# Patient Record
Sex: Male | Born: 1990 | Race: White | Hispanic: No | Marital: Married | Smoking: Current every day smoker
Health system: Southern US, Community
[De-identification: ages and names within clinical notes are randomized; demographics above are authoritative.]

## PROBLEM LIST (undated history)

## (undated) DIAGNOSIS — I300 Acute nonspecific idiopathic pericarditis: Secondary | ICD-10-CM

## (undated) DIAGNOSIS — K219 Gastro-esophageal reflux disease without esophagitis: Secondary | ICD-10-CM

## (undated) HISTORY — PX: APPENDECTOMY: SHX54

## (undated) HISTORY — PX: OTHER SURGICAL HISTORY: SHX169

---

## 2017-05-08 ENCOUNTER — Other Ambulatory Visit: Payer: Self-pay

## 2017-05-08 ENCOUNTER — Encounter (HOSPITAL_COMMUNITY): Payer: Self-pay

## 2017-05-08 ENCOUNTER — Emergency Department (HOSPITAL_COMMUNITY)
Admission: EM | Admit: 2017-05-08 | Discharge: 2017-05-08 | Disposition: A | Discharge status: Home / Self Care | Payer: 59 | Attending: Emergency Medicine | Admitting: Emergency Medicine

## 2017-05-08 ENCOUNTER — Emergency Department (HOSPITAL_COMMUNITY): Payer: 59

## 2017-05-08 DIAGNOSIS — I300 Acute nonspecific idiopathic pericarditis: Secondary | ICD-10-CM | POA: Insufficient documentation

## 2017-05-08 DIAGNOSIS — F17210 Nicotine dependence, cigarettes, uncomplicated: Secondary | ICD-10-CM | POA: Diagnosis not present

## 2017-05-08 DIAGNOSIS — R112 Nausea with vomiting, unspecified: Secondary | ICD-10-CM | POA: Insufficient documentation

## 2017-05-08 DIAGNOSIS — R0789 Other chest pain: Secondary | ICD-10-CM | POA: Diagnosis present

## 2017-05-08 HISTORY — DX: Gastro-esophageal reflux disease without esophagitis: K21.9

## 2017-05-08 LAB — CBC WITH DIFFERENTIAL/PLATELET
BASOS PCT: 0 %
Basophils Absolute: 0 10*3/uL (ref 0.0–0.1)
EOS ABS: 0 10*3/uL (ref 0.0–0.7)
Eosinophils Relative: 0 %
HEMATOCRIT: 49.5 % (ref 39.0–52.0)
Hemoglobin: 16.9 g/dL (ref 13.0–17.0)
Lymphocytes Relative: 24 %
Lymphs Abs: 0.9 10*3/uL (ref 0.7–4.0)
MCH: 31 pg (ref 26.0–34.0)
MCHC: 34.1 g/dL (ref 30.0–36.0)
MCV: 90.8 fL (ref 78.0–100.0)
MONOS PCT: 10 %
Monocytes Absolute: 0.4 10*3/uL (ref 0.1–1.0)
Neutro Abs: 2.6 10*3/uL (ref 1.7–7.7)
Neutrophils Relative %: 66 %
Platelets: 139 10*3/uL — ABNORMAL LOW (ref 150–400)
RBC: 5.45 MIL/uL (ref 4.22–5.81)
RDW: 12 % (ref 11.5–15.5)
WBC: 4 10*3/uL (ref 4.0–10.5)

## 2017-05-08 LAB — COMPREHENSIVE METABOLIC PANEL
ALBUMIN: 3.8 g/dL (ref 3.5–5.0)
ALK PHOS: 57 U/L (ref 38–126)
ALT: 77 U/L — ABNORMAL HIGH (ref 17–63)
ANION GAP: 13 (ref 5–15)
AST: 46 U/L — ABNORMAL HIGH (ref 15–41)
BILIRUBIN TOTAL: 1.2 mg/dL (ref 0.3–1.2)
BUN: 14 mg/dL (ref 6–20)
CALCIUM: 8.7 mg/dL — AB (ref 8.9–10.3)
CO2: 22 mmol/L (ref 22–32)
Chloride: 98 mmol/L — ABNORMAL LOW (ref 101–111)
Creatinine, Ser: 1.13 mg/dL (ref 0.61–1.24)
GFR calc non Af Amer: >60 mL/min (ref >60)
GLUCOSE: 105 mg/dL — AB (ref 65–99)
POTASSIUM: 3.9 mmol/L (ref 3.5–5.1)
SODIUM: 133 mmol/L — AB (ref 135–145)
TOTAL PROTEIN: 7.7 g/dL (ref 6.5–8.1)

## 2017-05-08 LAB — URINALYSIS, ROUTINE W REFLEX MICROSCOPIC
BACTERIA UA: NONE SEEN
BILIRUBIN URINE: NEGATIVE
GLUCOSE, UA: NEGATIVE mg/dL
Ketones, ur: NEGATIVE mg/dL
LEUKOCYTES UA: NEGATIVE
NITRITE: NEGATIVE
PH: 5 (ref 5.0–8.0)
Protein, ur: 100 mg/dL — AB
SPECIFIC GRAVITY, URINE: 1.031 — AB (ref 1.005–1.030)

## 2017-05-08 LAB — TROPONIN I: Troponin I: <0.03 ng/mL (ref <0.03)

## 2017-05-08 LAB — SEDIMENTATION RATE: SED RATE: 7 mm/h (ref 0–16)

## 2017-05-08 LAB — C-REACTIVE PROTEIN: CRP: 3.2 mg/dL — ABNORMAL HIGH (ref <1.0)

## 2017-05-08 LAB — LIPASE, BLOOD: Lipase: 31 U/L (ref 11–51)

## 2017-05-08 MED ORDER — COLCHICINE 0.6 MG PO TABS: 0.6000 mg | ORAL_TABLET | Freq: Two times a day (BID) | ORAL | 0 refills | Status: DC

## 2017-05-08 MED ORDER — GI COCKTAIL ~~LOC~~
30.0000 mL | Freq: Once | ORAL | Status: AC
  Administered 2017-05-08: 30 mL via ORAL
  Filled 2017-05-08: qty 30

## 2017-05-08 MED ORDER — PANTOPRAZOLE SODIUM 40 MG PO TBEC
40.0000 mg | DELAYED_RELEASE_TABLET | Freq: Once | ORAL | Status: AC
  Administered 2017-05-08: 40 mg via ORAL
  Filled 2017-05-08: qty 1

## 2017-05-08 MED ORDER — KETOROLAC TROMETHAMINE 30 MG/ML IJ SOLN
60.0000 mg | Freq: Once | INTRAMUSCULAR | Status: AC
  Administered 2017-05-08: 60 mg via INTRAMUSCULAR
  Filled 2017-05-08: qty 2

## 2017-05-08 MED ORDER — KETOROLAC TROMETHAMINE 30 MG/ML IJ SOLN
30.0000 mg | Freq: Once | INTRAMUSCULAR | Status: DC
Start: 2017-05-08 — End: 2017-05-08

## 2017-05-08 MED ORDER — OMEPRAZOLE 20 MG PO CPDR: 20.0000 mg | DELAYED_RELEASE_CAPSULE | Freq: Every day | ORAL | 0 refills | Status: DC

## 2017-05-08 MED ORDER — PANTOPRAZOLE SODIUM 40 MG IV SOLR: 40.0000 mg | Freq: Once | INTRAVENOUS | Status: DC

## 2017-05-08 MED ORDER — IBUPROFEN 800 MG PO TABS: 800.0000 mg | ORAL_TABLET | Freq: Three times a day (TID) | ORAL | 0 refills | Status: DC

## 2017-05-08 NOTE — ED Notes (Signed)
Patient transported to X-ray 

## 2017-05-08 NOTE — ED Triage Notes (Signed)
Pt reports having the epigastric pain (substernal) start yesterday. Pt reports he has had episodes like this before and most of the time he can take a TUMS and it would resolve, but this time it has not resolved. Pt reports being "sick, maybe the flu" b/c he received flu shot last week. Has had nausea and vomiting with decreased appetite. Pt has consumed half bowl of mash potatoes today and the pain got worse. No vomiting today, last time was yesterday, pt able to keep down fluids today. Describes pain as dull aching. Denies SOB, sweating, or pain radiation.

## 2017-05-08 NOTE — Discharge Instructions (Signed)
Take medications as prescribed.  Establish care with a primary care physician.  Return to the ED if you develop new or worsening symptoms.

## 2017-05-08 NOTE — ED Provider Notes (Signed)
[facility] EMERGENCY DEPARTMENT Provider Note   CSN: [id number] Arrival date & time: 05/08/17  0434     History   Chief Complaint Chief Complaint  Patient presents with  . Abdominal Pain    pain located substernal    HPI Richard Sanchez is a 27 y.o. male.  Patient with history of self diagnosed acid reflux presenting with substernal central chest pain since 3 PM yesterday.  This started after he ate a bowl of potatoes.  He has had episodes like this in the past that usually improved with Tums but he took Tums 4 times in the past 12 hours with no resolution of symptoms.  Denies any nausea or vomiting.  States she was having several episodes of vomiting 2 or 3 days ago after eating.  He has not had any vomiting for the past 2 days.  No fever.  Pain is constant.  Nothing makes it better or worse.  It is somewhat better with bending forward.  It does not radiate to her arms, back or neck.  No shortness of breath, fever, chills, urinary symptoms.  Denies any excessive alcohol or NSAID use.  previous appendectomy.   The history is provided by the patient.  Abdominal Pain   Associated symptoms include nausea and vomiting. Pertinent negatives include fever, headaches and arthralgias.    Past Medical History:  Diagnosis Date  . Acid reflux     There are no active problems to display for this patient.   Past Surgical History:  Procedure Laterality Date  . APPENDECTOMY    . kidney surgery (38 months old)         Home Medications    Prior to Admission medications   Not on File    Family History No family history on file.  Social History Social History   Tobacco Use  . Smoking status: Current Some Day Smoker    Packs/day: 0.50    Types: Cigarettes  . Smokeless tobacco: Never Used  Substance Use Topics  . Alcohol use: Yes    Comment: occasionally  . Drug use: No     Allergies   Patient has no known allergies.   Review of Systems Review of Systems    Constitutional: Negative for activity change, appetite change and fever.  HENT: Negative for congestion and nosebleeds.   Eyes: Negative for visual disturbance.  Respiratory: Negative for chest tightness.   Gastrointestinal: Positive for abdominal pain, nausea and vomiting. Negative for blood in stool.  Genitourinary: Negative for testicular pain and urgency.  Musculoskeletal: Negative for arthralgias and back pain.  Skin: Negative for rash.  Neurological: Negative for dizziness, weakness and headaches.   all other systems are negative except as noted in the HPI and PMH.     Physical Exam Updated Vital Signs BP (!) 138/105 (BP Location: Right Arm)   Pulse (!) 112   Temp 99.3 F (37.4 C) (Oral)   Resp 13   Ht 5\' 10"  (1.778 m)   Wt 107 kg (236 lb)   SpO2 96%   BMI 33.86 kg/m   Physical Exam  Constitutional: He is oriented to person, place, and time. He appears well-developed and well-nourished. No distress.  HENT:  Head: Normocephalic and atraumatic.  Mouth/Throat: Oropharynx is clear and moist. No oropharyngeal exudate.  Eyes: Conjunctivae and EOM are normal. Pupils are equal, round, and reactive to light.  Neck: Normal range of motion. Neck supple.  No meningismus.  Cardiovascular: Normal rate, regular rhythm, normal heart sounds and  intact distal pulses.  No murmur heard. Pulmonary/Chest: Effort normal and breath sounds normal. No respiratory distress. He exhibits no tenderness.  Chest wall nontender  Abdominal: Soft. There is no tenderness. There is no rebound and no guarding.  Abdomen soft.  No right upper quadrant tenderness.  No epigastric tenderness  Musculoskeletal: Normal range of motion. He exhibits no edema or tenderness.  No CVA tenderness  Neurological: He is alert and oriented to person, place, and time. No cranial nerve deficit. He exhibits normal muscle tone. Coordination normal.  No ataxia on finger to nose bilaterally. No pronator drift. 5/5 strength  throughout. CN 2-12 intact.Equal grip strength. Sensation intact.   Skin: Skin is warm.  Psychiatric: He has a normal mood and affect. His behavior is normal.  Nursing note and vitals reviewed.    ED Treatments / Results  Labs (all labs ordered are listed, but only abnormal results are displayed) Labs Reviewed  CBC WITH DIFFERENTIAL/PLATELET - Abnormal; Notable for the following components:      Result Value   Platelets 139 (*)    All other components within normal limits  COMPREHENSIVE METABOLIC PANEL - Abnormal; Notable for the following components:   Sodium 133 (*)    Chloride 98 (*)    Glucose, Bld 105 (*)    Calcium 8.7 (*)    AST 46 (*)    ALT 77 (*)    All other components within normal limits  LIPASE, BLOOD  TROPONIN I  URINALYSIS, ROUTINE W REFLEX MICROSCOPIC  SEDIMENTATION RATE  C-REACTIVE PROTEIN    EKG  EKG Interpretation  Date/Time:  Tuesday May 08 2017 05:04:48 EST Ventricular Rate:  93 PR Interval:    QRS Duration: 89 QT Interval:  336 QTC Calculation: 418 R Axis:   -41 Text Interpretation:  Sinus rhythm Left axis deviation ST elevation suggests acute pericarditis No previous ECGs available Confirmed by Glynn Octaveancour, Buffy Ehler [phone]([id number]) on 05/08/2017 5:11:22 AM       Radiology Dg Abdomen Acute W/chest  Result Date: 05/08/2017 CLINICAL DATA:  Acute onset of epigastric abdominal pain and substernal chest pain. Nausea and vomiting. EXAM: DG ABDOMEN ACUTE W/ 1V CHEST COMPARISON:  None. FINDINGS: The lungs are well-aerated and clear. There is no evidence of focal opacification, pleural effusion or pneumothorax. The cardiomediastinal silhouette is within normal limits. The visualized bowel gas pattern is unremarkable. Scattered stool and air are seen within the colon; there is no evidence of small bowel dilatation to suggest obstruction. No free intra-abdominal air is identified on the provided upright view. No acute osseous abnormalities are seen; the sacroiliac  joints are unremarkable in appearance. IMPRESSION: 1. Unremarkable bowel gas pattern; no free intra-abdominal air seen. Small amount of stool noted in the colon. 2. No acute cardiopulmonary process seen. Electronically Signed   By: Roanna RaiderJeffery  Chang M.D.   On: 05/08/2017 05:46    Procedures Procedures (including critical care time)  Medications Ordered in ED Medications  gi cocktail (Maalox,Lidocaine,Donnatal) (not administered)     Initial Impression / Assessment and Plan / ED Course  I have reviewed the triage vital signs and the nursing notes.  Pertinent labs & imaging results that were available during my care of the patient were reviewed by me and considered in my medical decision making (see chart for details).    Patient with 12-hour history of constant substernal chest pain recent nausea and vomiting several days ago.  Chest pain is not reproducible.  There is no abdominal tenderness.  EKG with J  point elevation, consider pericarditis.   Labs reassuring.  Minimal elevation of transaminases.  Lipase normal.  Patient's pain is somewhat improved with bending forward which is consistent with pericarditis.  Troponin negative.  No pericardial effusion on bedside ultrasound. no obvious gallstones.  Will treat for possible pericarditis with NSAIDs and colchicine. Will also start PPI. Low suspicion for ACS or PE.  Needs to establish care with PCP. Return precautions discussed.    EMERGENCY DEPARTMENT [place] CARDIAC EXAM "Study: Limited Ultrasound of the Heart and Pericardium"  INDICATIONS:Chest pain Multiple views of the heart and pericardium were obtained in real-time with a multi-frequency probe.  PERFORMED ZO:XWRUEA IMAGES ARCHIVED?: Yes LIMITATIONS:  Body habitus VIEWS USED: Apical 4 chamber  INTERPRETATION: Cardiac activity present, Pericardial effusioin absent, Cardiac tamponade absent and Normal contractility No pericardial effusion. No obvious gallstones or gallbladder wall  thickening.   Final Clinical Impressions(s) / ED Diagnoses   Final diagnoses:  Acute idiopathic pericarditis  Atypical chest pain    ED Discharge Orders    None       Anneta Rounds, Jeannett Senior, MD 05/08/17 [phone]

## 2017-05-14 ENCOUNTER — Encounter: Payer: Self-pay | Admitting: Gastroenterology

## 2017-05-28 ENCOUNTER — Emergency Department: Payer: 59

## 2017-05-28 ENCOUNTER — Other Ambulatory Visit: Payer: Self-pay

## 2017-05-28 ENCOUNTER — Encounter: Payer: Self-pay | Admitting: Emergency Medicine

## 2017-05-28 DIAGNOSIS — Z79899 Other long term (current) drug therapy: Secondary | ICD-10-CM | POA: Insufficient documentation

## 2017-05-28 DIAGNOSIS — E869 Volume depletion, unspecified: Secondary | ICD-10-CM | POA: Diagnosis not present

## 2017-05-28 DIAGNOSIS — R55 Syncope and collapse: Secondary | ICD-10-CM | POA: Insufficient documentation

## 2017-05-28 DIAGNOSIS — R112 Nausea with vomiting, unspecified: Secondary | ICD-10-CM | POA: Diagnosis not present

## 2017-05-28 DIAGNOSIS — F17210 Nicotine dependence, cigarettes, uncomplicated: Secondary | ICD-10-CM | POA: Insufficient documentation

## 2017-05-28 DIAGNOSIS — R42 Dizziness and giddiness: Secondary | ICD-10-CM | POA: Insufficient documentation

## 2017-05-28 LAB — CBC
HCT: 45.1 % (ref 40.0–52.0)
HEMOGLOBIN: 15.5 g/dL (ref 13.0–18.0)
MCH: 31.1 pg (ref 26.0–34.0)
MCHC: 34.4 g/dL (ref 32.0–36.0)
MCV: 90.5 fL (ref 80.0–100.0)
PLATELETS: 235 10*3/uL (ref 150–440)
RBC: 4.98 MIL/uL (ref 4.40–5.90)
RDW: 13 % (ref 11.5–14.5)
WBC: 13 10*3/uL — ABNORMAL HIGH (ref 3.8–10.6)

## 2017-05-28 LAB — BASIC METABOLIC PANEL
ANION GAP: 11 (ref 5–15)
BUN: 17 mg/dL (ref 6–20)
CHLORIDE: 105 mmol/L (ref 101–111)
CO2: 24 mmol/L (ref 22–32)
Calcium: 9.7 mg/dL (ref 8.9–10.3)
Creatinine, Ser: 0.99 mg/dL (ref 0.61–1.24)
GFR calc Af Amer: >60 mL/min (ref >60)
GFR calc non Af Amer: >60 mL/min (ref >60)
GLUCOSE: 110 mg/dL — AB (ref 65–99)
POTASSIUM: 3.7 mmol/L (ref 3.5–5.1)
Sodium: 140 mmol/L (ref 135–145)

## 2017-05-28 LAB — TROPONIN I

## 2017-05-28 NOTE — ED Triage Notes (Signed)
Pt arrived to the ED for complaints of being "light headed." Pt reports that he was working doing extraneous work and felt like he was going to "pass out" while breathing fast. Pt reports that he felt better and continued to work. Later on the patient felt sick again and went to the bathrom and vomited.Pt is AOx4 in no apparent distress.

## 2017-05-29 ENCOUNTER — Encounter: Payer: Self-pay | Admitting: Emergency Medicine

## 2017-05-29 ENCOUNTER — Emergency Department
Admission: EM | Admit: 2017-05-29 | Discharge: 2017-05-29 | Disposition: A | Discharge status: Home / Self Care | Payer: 59 | Attending: Emergency Medicine | Admitting: Emergency Medicine

## 2017-05-29 DIAGNOSIS — E869 Volume depletion, unspecified: Secondary | ICD-10-CM

## 2017-05-29 DIAGNOSIS — R55 Syncope and collapse: Secondary | ICD-10-CM

## 2017-05-29 HISTORY — DX: Acute nonspecific idiopathic pericarditis: I30.0

## 2017-05-29 MED ORDER — SODIUM CHLORIDE 0.9 % IV BOLUS (SEPSIS)
1000.0000 mL | INTRAVENOUS | Status: AC
  Administered 2017-05-29: 1000 mL via INTRAVENOUS

## 2017-05-29 NOTE — ED Notes (Signed)
Pt reports that he was at work when he felt really light headed and he stepped outside and sat down to feel better because he felt like he was going to throw up and pass out. He said his face felt "tingly". He said he went back inside and tried to work again when the same thing happened.

## 2017-05-29 NOTE — Discharge Instructions (Signed)
You have been seen today in the Emergency Department (ED)  for near-syncope (almost passing out).  Your workup including labs and EKG show reassuring results.  Your symptoms may be due to dehydration, so it is important that you drink plenty of non-alcoholic fluids. ° °Please call your regular doctor as soon as possible to schedule the next available clinic appointment to follow up with him/her regarding your visit to the ED and your symptoms.  Return to the Emergency Department (ED)  if you have any further syncopal episodes (pass out again) or develop ANY chest pain, pressure, tightness, trouble breathing, sudden sweating, or other symptoms that concern you. ° °

## 2017-05-29 NOTE — ED Provider Notes (Signed)
[facility] Emergency Department Provider Note  ____________________________________________   First MD Initiated Contact with Patient 05/29/17 [phone]     (approximate)  I have reviewed the triage vital signs and the nursing notes.   HISTORY  Chief Complaint Near Syncope    HPI Richard Sanchez is a 27 y.o. male with medical history as listed below (the pericarditis was diagnosed not quite 3 weeks ago) who presents for evaluation of lightheadedness, feeling like he was going to pass out, and a couple of episodes of vomiting.  All this occurred while he was at work.  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  Today he was working at [employer] when he began to feel lightheaded.  He continue to work through it but then he got progressively worse and his symptoms became severe.  After resting for 10 or 15 minutes he felt better, but then he went back to work and started to feel like he was going to pass out.  He had a couple of episodes of vomiting, and was diaphoretic and persistently nauseated until he rested a bit more.  Currently he is asymptomatic.  He has not been ill recently except for the visit to [facility] not quite 3 weeks ago.  At that time he was having chest pain versus upper abdominal pain and was diagnosed with idiopathic pericarditis.   Starting on a PPI he completed a course of colchicine and he states that he has not had any additional symptoms since that time.  Denies recent fever/chills, chest pain today, shortness of breath, abdominal pain, and dysuria.  Resting made the symptoms better and exertion made the symptoms worse.  They were severe at their worst.   Past Medical History:  Diagnosis Date  . Acid reflux   . Idiopathic pericarditis     There are no active problems to display for this patient.   Past Surgical History:  Procedure Laterality Date  . APPENDECTOMY    . kidney surgery (30 months old)      Prior  to Admission medications   Medication Sig Start Date End Date Taking? Authorizing Provider  colchicine 0.6 MG tablet Take 1 tablet (0.6 mg total) by mouth 2 (two) times daily. 05/08/17   Rancour, Jeannett Senior, MD  ibuprofen (ADVIL,MOTRIN) 800 MG tablet Take 1 tablet (800 mg total) by mouth 3 (three) times daily. 05/08/17   Rancour, Jeannett Senior, MD  omeprazole (PRILOSEC) 20 MG capsule Take 1 capsule (20 mg total) by mouth daily. 05/08/17   Glynn Octave, MD    Allergies Patient has no known allergies.  History reviewed. No pertinent family history.  Social History Social History   Tobacco Use  . Smoking status: Current Some Day Smoker    Packs/day: 0.50    Types: Cigarettes  . Smokeless tobacco: Never Used  Substance Use Topics  . Alcohol use: Yes    Comment: occasionally  . Drug use: No    Review of Systems Constitutional: No fever/chills Eyes: No visual changes. ENT: No sore throat. Cardiovascular: Denies chest pain. Respiratory: Denies shortness of breath. Gastrointestinal: No abdominal pain.  Nausea with several episodes of emesis in a row.  No diarrhea.  No constipation. Genitourinary: Negative for dysuria. Musculoskeletal: Negative for neck pain.  Negative for back pain. Integumentary: Negative for rash. Neurological: Lightheaded and dizzy, nearly passed out.  No focal numbness nor weakness.  No headache.   ____________________________________________   PHYSICAL EXAM:  VITAL SIGNS: ED  Triage Vitals  Enc Vitals Group     BP 05/28/17 2317 (!) 160/105     Pulse Rate 05/28/17 2317 96     Resp 05/28/17 2317 16     Temp 05/28/17 2317 97.7 F (36.5 C)     Temp Source 05/28/17 2317 Oral     SpO2 05/28/17 2317 96 %     Weight 05/28/17 2318 107 kg (236 lb)     Height 05/28/17 2318 1.778 m (5\' 10" )     Head Circumference --      Peak Flow --      Pain Score 05/28/17 2317 0     Pain Loc --      Pain Edu? --      Excl. in GC? --     Constitutional: Alert and oriented. Well  appearing and in no acute distress. Eyes: Conjunctivae are normal.  Head: Atraumatic. Nose: No congestion/rhinnorhea. Mouth/Throat: Mucous membranes are moist. Neck: No stridor.  No meningeal signs.   Cardiovascular: Normal rate, regular rhythm. Good peripheral circulation. Grossly normal heart sounds. Respiratory: Normal respiratory effort.  No retractions. Lungs CTAB. Gastrointestinal: Soft and nontender. No distention.  Musculoskeletal: No lower extremity tenderness nor edema. No gross deformities of extremities. Neurologic:  Normal speech and language. No gross focal neurologic deficits are appreciated.  Skin:  Skin is warm, dry and intact. No rash noted. Psychiatric: Mood and affect are normal. Speech and behavior are normal.  ____________________________________________   LABS (all labs ordered are listed, but only abnormal results are displayed)  Labs Reviewed  BASIC METABOLIC PANEL - Abnormal; Notable for the following components:      Result Value   Glucose, Bld 110 (*)    All other components within normal limits  CBC - Abnormal; Notable for the following components:   WBC 13.0 (*)    All other components within normal limits  TROPONIN I   ____________________________________________  EKG  ED ECG REPORT I, Loleta Roseory Miray Mancino, the attending physician, personally viewed and interpreted this ECG.  Date: 05/28/2017 EKG Time: 23:19 Rate: 97 Rhythm: normal sinus rhythm QRS Axis: normal Intervals: normal ST/T Wave abnormalities: normal Narrative Interpretation: no evidence of acute ischemia  ____________________________________________  RADIOLOGY   Dg Chest 2 View  Result Date: 05/28/2017 CLINICAL DATA:  Dizziness and near syncope EXAM: CHEST  2 VIEW COMPARISON:  Chest radiograph 05/08/2017 FINDINGS: The heart size and mediastinal contours are within normal limits. Both lungs are clear. The visualized skeletal structures are unremarkable. IMPRESSION: No active  cardiopulmonary disease. Electronically Signed   By: Deatra RobinsonKevin  Herman M.D.   On: 05/28/2017 23:35    ____________________________________________   PROCEDURES  Critical Care performed: No   Procedure(s) performed:   Procedures   ____________________________________________   INITIAL IMPRESSION / ASSESSMENT AND PLAN / ED COURSE  As part of my medical decision making, I reviewed the following data within the electronic MEDICAL RECORD NUMBER Nursing notes reviewed and incorporated, Labs reviewed , EKG interpreted , Old chart reviewed and Notes from prior ED visits    Differential diagnosis includes, but is not limited to, vasovagal episode leading to near syncope in the setting of volume depletion, ACS or other cardiac cause of near syncope, CVA, tox/medication, infectious, etc. patient is well-appearing and asymptomatic at this time.  His vital signs are reassuring; even though he was slightly tachycardic and hypertensive upon arrival, his vital signs have stabilized.  He has gotten a liter of fluids and is asymptomatic.  I believe that his symptoms  were the result of volume depletion and working a lot recently.  He has no symptoms of pericarditis anymore and reassuring lab work; there is nothing to make me suspicious for myocarditis or other acute or emergent condition.  I discussed all this with the patient and he is comfortable with the plan for outpatient follow-up.  I gave my usual and customary return precautions.      ____________________________________________  FINAL CLINICAL IMPRESSION(S) / ED DIAGNOSES  Final diagnoses:  Near syncope  Volume depletion     MEDICATIONS GIVEN DURING THIS VISIT:  Medications  sodium chloride 0.9 % bolus 1,000 mL (1,000 mLs Intravenous New Bag/Given 05/29/17 0243)     ED Discharge Orders    None       Note:  This document was prepared using Dragon voice recognition software and may include unintentional dictation errors.    Loleta Rose, MD 05/29/17 [phone]

## 2017-06-07 ENCOUNTER — Encounter: Payer: Self-pay | Admitting: Gastroenterology

## 2017-06-07 ENCOUNTER — Ambulatory Visit: Payer: 59 | Admitting: Gastroenterology

## 2017-06-07 DIAGNOSIS — R079 Chest pain, unspecified: Secondary | ICD-10-CM | POA: Diagnosis not present

## 2017-06-07 MED ORDER — OMEPRAZOLE 20 MG PO CPDR: 20.0000 mg | DELAYED_RELEASE_CAPSULE | Freq: Every day | ORAL | 5 refills | Status: AC

## 2017-06-07 NOTE — Assessment & Plan Note (Signed)
MOST LIKELY DUE TO PERICARDITIS > GERD. SYMPTOMS CONTROLLED/RESOLVED.  DRINK WATER TO KEEP YOUR URINE LIGHT YELLOW.  Do not drink DIET SODA, AVOID HIGH FRUCTOSE CORN SYRUP, chew SUGAR FREE GUM, OR USE ARTIFICIAL SWEETENERS. USE STEVIA AS A SWEETENER, CANE SUGAR, RAW SUGAR, OR AGAVE NECTAR. CONTINUE YOUR WEIGHT LOSS EFFORTS. A WEIGHT OF 207 LBS OR LESS WILL KEEP YOUR BODY MASS INDEX(BMI) UNDER 30. AVOID REFLUX TRIGGERS.  HANDOUT GIVEN. FOLLOW A LOW FAT DIET. MEATS SHOULD BE BAKED, BROILED, OR BOILED. AVOID FRIED FOODS. CONTINUE OMEPRAZOLE.  TAKE 30 MINUTES PRIOR TO YOUR FIRST MEAL. PLEASE CALL WITH QUESTIONS OR CONCERNS. FOLLOW UP IN 6 MOS.

## 2017-06-07 NOTE — Progress Notes (Addendum)
   Subjective:    Patient ID: Richard Sanchez, male    DOB: 02-May-1991, 27 y.o.   MRN: [id number]  HPI SEEN ED JAN 1 FOR SEVERE CHEST PAIN. DIAGNOSED WITH PERICARDITIS. GIVENS MEDS A PRILOSEC. HAVEN'T HAD ANYMORE SYMPTOMS SINCE ADMISSION/DISCHARGE.  EPISODE PRECEDED BY NAUSEA/VOMITING(NO BLOOD). DIDN'T EAT NEXT DAY TRIED TO EAT. ATE @330 -4PM AND THEN CHEST STARTED HURTING AND GOT PROGRESSIVELY WORSE. COULDN'T SLEEP OR LAY DOWN. @4  AM CAME TO ED. SEVERE BURNING IN MIDDLE. NO RECREATIONAL DRUGS OR REGULAR ETOH. BMs: REGULAR.   PT DENIES FEVER, CHILLS, HEMATOCHEZIA, HEMATEMESIS, nausea, vomiting, melena, diarrhea, SHORTNESS OF BREATH, CHANGE IN BOWEL IN HABITS, constipation, abdominal pain, problems swallowing, problems with sedation, heartburn or indigestion.  Past Medical History:  Diagnosis Date  . Acid reflux   . Idiopathic pericarditis    Past Surgical History:  Procedure Laterality Date  . APPENDECTOMY    . kidney surgery 53(18 months old)     No Known Allergies  Current Outpatient Medications  Medication Sig Dispense Refill  . omeprazole (PRILOSEC) 20 MG capsule Take 1 capsule (20 mg total) by mouth daily. 30 capsule 0  .      .       Family History  Problem Relation Age of Onset  . Colon cancer Neg Hx   . Colon polyps Neg Hx    Social History   Socioeconomic History  . Marital status: Married    Spouse name: None  . Number of children: None  . Years of education: None  . Highest education level: None  Social Needs  . Financial resource strain: None  . Food insecurity - worry: None  . Food insecurity - inability: None  . Transportation needs - medical: None  . Transportation needs - non-medical: None  Occupational History  . None  Tobacco Use  . Smoking status: Former Smoker    Packs/day: 0.50    Types: Cigarettes  . Smokeless tobacco: Never Used  Substance and Sexual Activity  . Alcohol use: Yes    Comment: occasionally  . Drug use: No  . Sexual activity:  None  Other Topics Concern  . None  Social History Narrative   WORKS IN CONSTRUCTION: PIPE FITTER. MARRIED. WIFE IS PREGNANT. BORN IN [place] AND CAME TO [place] IN 1997.   Review of Systems PER HPI OTHERWISE ALL SYSTEMS ARE NEGATIVE.    Objective:   Physical Exam  Constitutional: He is oriented to person, place, and time. He appears well-developed and well-nourished. No distress.  HENT:  Head: Normocephalic and atraumatic.  Mouth/Throat: Oropharynx is clear and moist. No oropharyngeal exudate.  Eyes: Pupils are equal, round, and reactive to light. No scleral icterus.  Neck: Normal range of motion. Neck supple.  Cardiovascular: Normal rate, regular rhythm and normal heart sounds.  Pulmonary/Chest: Effort normal and breath sounds normal. No respiratory distress.  Abdominal: Soft. Bowel sounds are normal. He exhibits no distension. There is no tenderness.  Musculoskeletal: He exhibits no edema.  Lymphadenopathy:    He has no cervical adenopathy.  Neurological: He is alert and oriented to person, place, and time.  NO FOCAL DEFICITS  Skin:  MULTIPLE TATTOOS  Psychiatric: He has a normal mood and affect.  Vitals reviewed.     Assessment & Plan:

## 2017-06-07 NOTE — Progress Notes (Signed)
cc'ed to pcp °

## 2017-06-07 NOTE — Progress Notes (Signed)
ON RECALL  °

## 2017-06-07 NOTE — Patient Instructions (Addendum)
DRINK WATER TO KEEP YOUR URINE LIGHT YELLOW.  Do not drink DIET SODA, AVOID HIGH FRUCTOSE CORN SYRUP, chew SUGAR FREE GUM, OR USE ARTIFICIAL SWEETENERS. USE STEVIA AS A SWEETENER, CANE SUGAR, RAW SUGAR, OR AGAVE NECTAR.  CONTINUE YOUR WEIGHT LOSS EFFORTS. A WEIGHT OF 207 LBS OR LESS WILL KEEP YOUR BODY MASS INDEX(BMI) UNDER 30.  AVOID REFLUX TRIGGERS. SEE INFO BELOW.  FOLLOW A LOW FAT DIET. MEATS SHOULD BE BAKED, BROILED, OR BOILED. AVOID FRIED FOODS.  CONTINUE OMEPRAZOLE.  TAKE 30 MINUTES PRIOR TO YOUR FIRST MEAL.  PLEASE CALL WITH QUESTIONS OR CONCERNS.  FOLLOW UP IN 6 MOS.

## 2017-06-20 ENCOUNTER — Ambulatory Visit (INDEPENDENT_AMBULATORY_CARE_PROVIDER_SITE_OTHER): Payer: 59 | Admitting: Cardiovascular Disease

## 2017-06-20 ENCOUNTER — Encounter: Payer: Self-pay | Admitting: Cardiovascular Disease

## 2017-06-20 VITALS — BP 130/80 | HR 80 | Ht 70.0 in | Wt 224.6 lb

## 2017-06-20 DIAGNOSIS — K219 Gastro-esophageal reflux disease without esophagitis: Secondary | ICD-10-CM

## 2017-06-20 DIAGNOSIS — Z9289 Personal history of other medical treatment: Secondary | ICD-10-CM | POA: Diagnosis not present

## 2017-06-20 DIAGNOSIS — R55 Syncope and collapse: Secondary | ICD-10-CM | POA: Diagnosis not present

## 2017-06-20 DIAGNOSIS — I309 Acute pericarditis, unspecified: Secondary | ICD-10-CM

## 2017-06-20 NOTE — Progress Notes (Signed)
CARDIOLOGY CONSULT NOTE  Patient ID: Richard Sanchez MRN: [id number] DOB/AGE: [birth year] 27 y.o.  Admit date: (Not on file) Primary Physician: Zhou-Talbert, Elwyn Lade, MD Referring Physician: Alfonse Flavors, MD  Reason for Consultation: Pericarditis  HPI: Richard Sanchez is a 27 y.o. male who is being seen today for the evaluation of pericarditis at the request of Zhou-Talbert, Serena S,*.   He was evaluated in the ED on 05/08/17 for chest pain with nausea and vomiting.  I personally reviewed all relevant documentation, labs, and studies.  Symptoms improved with bending forward.  He was treated for pericarditis with NSAIDs and colchicine.  CBC was normal.  Renal function was normal.  ESR was normal.  C-reactive protein was elevated at 3.2.  Troponin was normal.  Radiography showed no evidence of an acute cardiopulmonary process.  I reviewed the ECG which demonstrated sinus tachycardia, 104 bpm, with subtle PR depression and J-point elevation.  He was then evaluated for near syncope on 05/29/17.  It occurred while he was at work.  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  He had associated nausea, vomiting, and diaphoresis.  Chest x-ray again showed no active cardiopulmonary disease.  He received IV fluids and symptoms resolved.  It was felt his symptoms were due to volume depletion.  Troponin was normal.  White blood cells were elevated at 13.  BUN 17, creatinine 0.99.  Sodium was 104.  I reviewed the ECG performed on 05/28/17 which demonstrated sinus rhythm with sinus arrhythmia and again subtle PR depressions and J-point elevation.  The patient denies any symptoms of chest pain, palpitations, shortness of breath, lightheadedness, dizziness, leg swelling, orthopnea, PND, and syncope.  He is back to working full-time and works roughly 10 hours daily.  He told me that during his most recent ED presentation for near syncope, he had been working in a very hot  environment and worked 12 hours without having eaten.  He said after the initial episode of pericarditis, he took 2 tablets of ibuprofen and then stopped it.  He took a total of 15 pills of colchicine.  He has now established with a gastroenterologist in [place] and was told to continue taking omeprazole and he tells me that he has had several years of gastroesophageal reflux disease.  Social history: He is married.  He was born in [place], [place].  His father still lives in [place].  The patient also lived in [place] and [place] for a period of time.  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  The company is based in [place].   No Known Allergies  Current Outpatient Medications  Medication Sig Dispense Refill  . omeprazole (PRILOSEC) 20 MG capsule Take 1 capsule (20 mg total) by mouth daily. 30 capsule 5   No current facility-administered medications for this visit.     Past Medical History:  Diagnosis Date  . Acid reflux   . Idiopathic pericarditis     Past Surgical History:  Procedure Laterality Date  . APPENDECTOMY    . kidney surgery (86 months old)      Social History   Socioeconomic History  . Marital status: Married    Spouse name: Not on file  . Number of children: Not on file  . Years of education: Not on file  . Highest education level: Not on file  Social Needs  . Financial resource strain: Not on  file  . Food insecurity - worry: Not on file  . Food insecurity - inability: Not on file  . Transportation needs - medical: Not on file  . Transportation needs - non-medical: Not on file  Occupational History  . Not on file  Tobacco Use  . Smoking status: Former Smoker    Packs/day: 0.50    Types: Cigarettes  . Smokeless tobacco: Never Used  Substance and Sexual Activity  . Alcohol use: Yes    Comment: occasionally  . Drug use: No  . Sexual activity: Not on file  Other Topics Concern  . Not on  file  Social History Narrative   WORKS IN CONSTRUCTION: PIPE FITTER. MARRIED. WIFE IS PREGNANT. BORN [place] AND CAME TO [place] IN 1997.     No family history of premature CAD in 1st degree relatives.  Current Meds  Medication Sig  . omeprazole (PRILOSEC) 20 MG capsule Take 1 capsule (20 mg total) by mouth daily.      Review of systems complete and found to be negative unless listed above in HPI    Physical exam Blood pressure 130/80, pulse 80, height 5' 10" (1.778 m), weight 224 lb 9.6 oz (101.9 kg), SpO2 97 %. General: NAD Neck: No JVD, no thyromegaly or thyroid nodule.  Lungs: Clear to auscultation bilaterally with normal respiratory effort. CV: Nondisplaced PMI. Regular rate and rhythm, normal S1/S2, no S3/S4, no murmur.  No peripheral edema.  No carotid bruit.    Abdomen: Soft, nontender, no distention.  Skin: Intact without lesions or rashes.  Neurologic: Alert and oriented x 3.  Psych: Normal affect. Extremities: No clubbing or cyanosis.  HEENT: Normal.   ECG: Most recent ECG reviewed.   Labs: Lab Results  Component Value Date/Time   K 3.7 05/28/2017 11:15 PM   BUN 17 05/28/2017 11:15 PM   CREATININE 0.99 05/28/2017 11:15 PM   ALT 77 (H) 05/08/2017 05:10 AM   HGB 15.5 05/28/2017 11:15 PM     Lipids: No results found for: LDLCALC, LDLDIRECT, CHOL, TRIG, HDL      ASSESSMENT AND PLAN:  1.  Pericarditis: Symptoms have resolved with colchicine and ibuprofen.  This was likely viral in etiology.  2.  Near syncope: This appeared to be due to dehydration.  The symptoms have resolved as well.  3.  GERD: He is currently on omeprazole.  Symptoms are stable.     Disposition: Follow up as needed  Signed: Kate Sable, M.D., F.A.C.C.  06/20/2017, 8:58 AM

## 2017-06-20 NOTE — Patient Instructions (Signed)
Medication Instructions:  Your physician recommends that you continue on your current medications as directed. Please refer to the Current Medication list given to you today.   Labwork: NONE  Testing/Procedures: NONE  Follow-Up: Your physician recommends that you schedule a follow-up appointment in: AS NEEDED      Any Other Special Instructions Will Be Listed Below (If Applicable).     If you need a refill on your cardiac medications before your next appointment, please call your pharmacy.   

## 2017-10-11 ENCOUNTER — Encounter: Payer: Self-pay | Admitting: Gastroenterology

## 2018-12-09 ENCOUNTER — Other Ambulatory Visit (HOSPITAL_COMMUNITY): Payer: Self-pay | Admitting: Family Medicine

## 2018-12-09 ENCOUNTER — Other Ambulatory Visit: Payer: Self-pay | Admitting: Family Medicine

## 2018-12-09 DIAGNOSIS — R7401 Elevation of levels of liver transaminase levels: Secondary | ICD-10-CM

## 2018-12-12 ENCOUNTER — Encounter (HOSPITAL_COMMUNITY): Payer: Self-pay

## 2018-12-12 ENCOUNTER — Ambulatory Visit (HOSPITAL_COMMUNITY): Payer: 59

## 2018-12-31 IMAGING — DX DG ABDOMEN ACUTE W/ 1V CHEST
3 series · 3 of 3 positions shown · non-contrast
Comparison: None.

CLINICAL DATA: Acute onset of epigastric abdominal pain and
substernal chest pain. Nausea and vomiting.

EXAM:
DG ABDOMEN ACUTE W/ 1V CHEST

[chest pa]
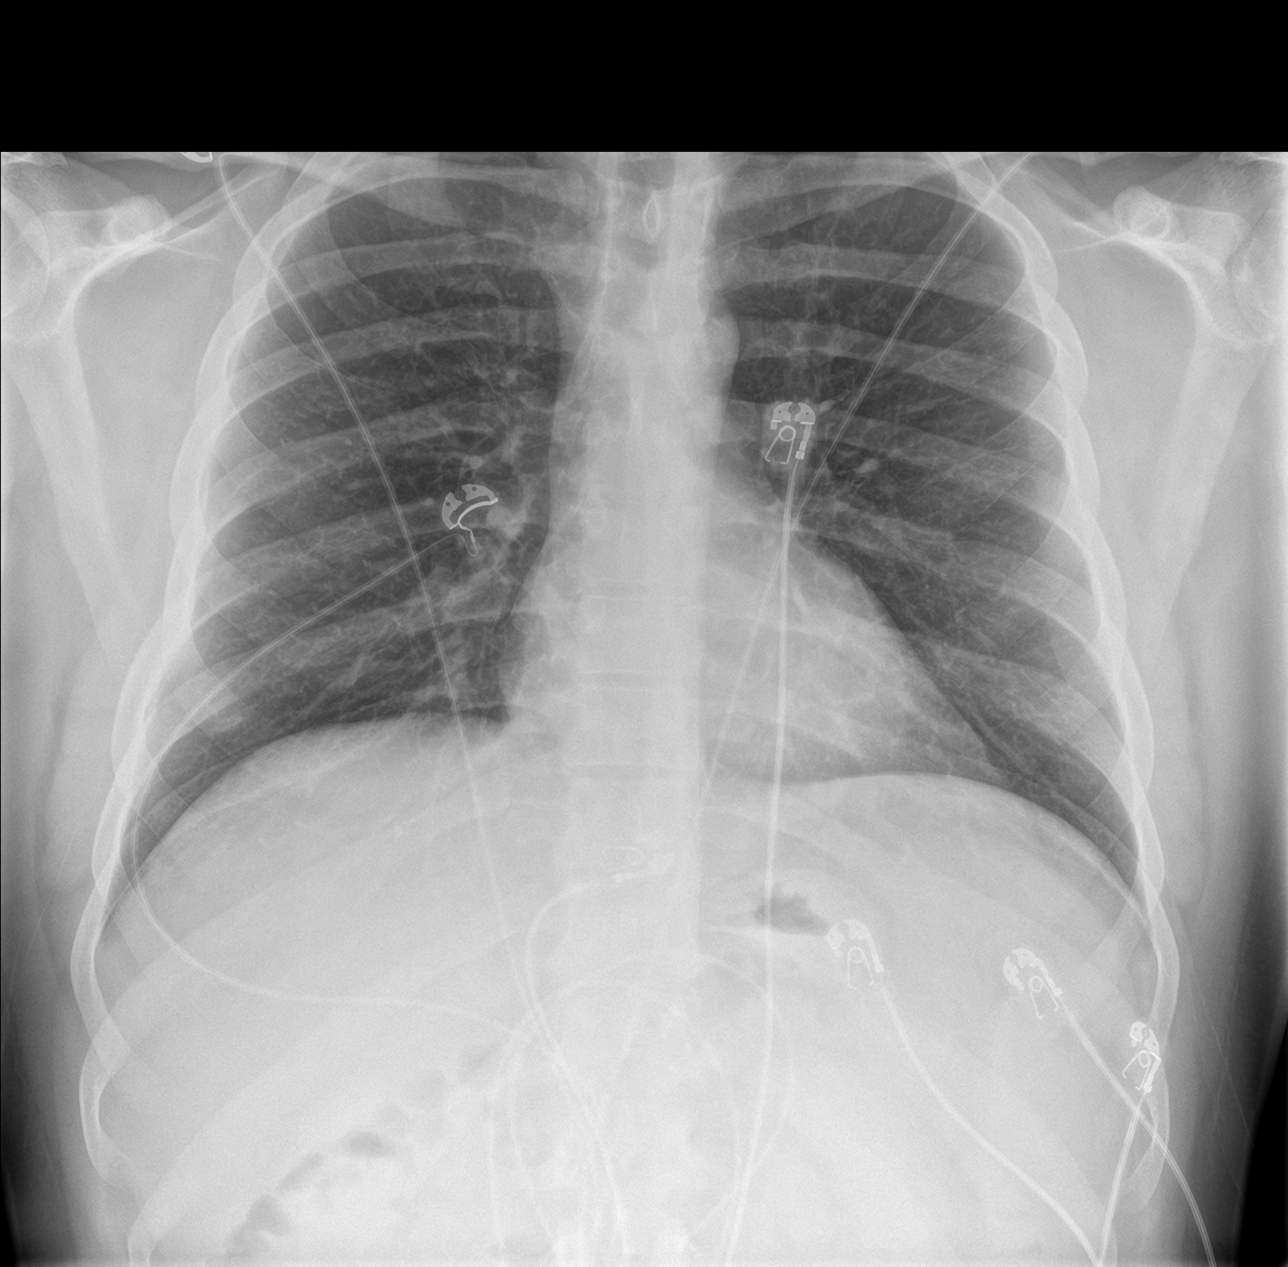

[abdomen erect]
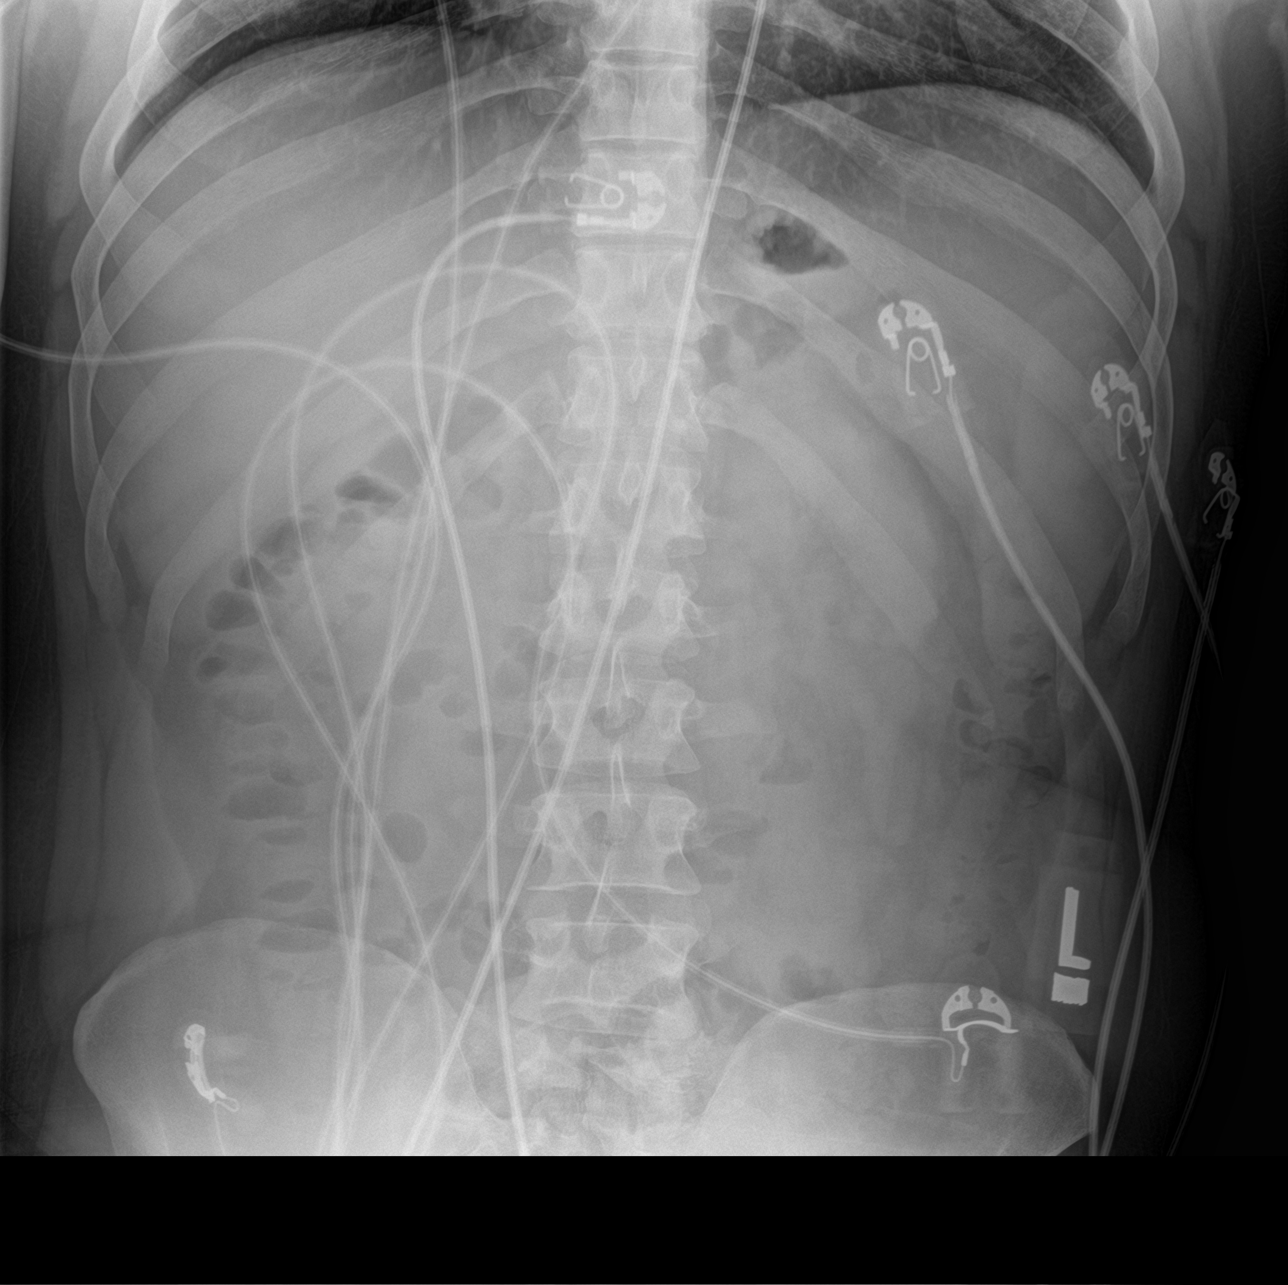

[abdomen supine]
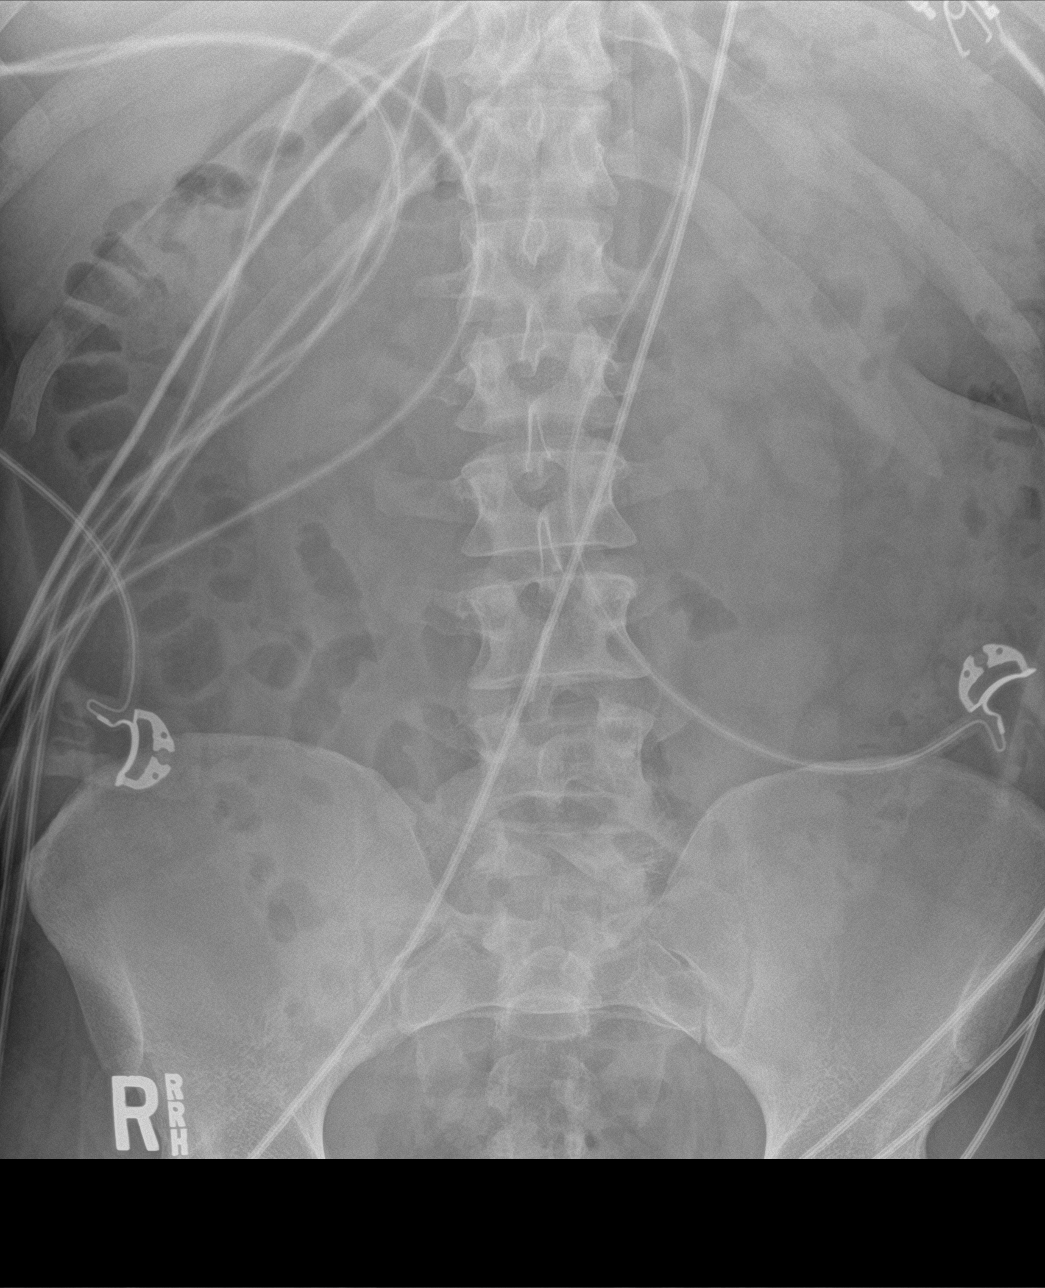

[3 of 3 positions shown; findings below may reference images not displayed]

FINDINGS: The lungs are well-aerated and clear. There is no evidence of focal
opacification, pleural effusion or pneumothorax. The
cardiomediastinal silhouette is within normal limits.

The visualized bowel gas pattern is unremarkable. Scattered stool
and air are seen within the colon; there is no evidence of small
bowel dilatation to suggest obstruction. No free intra-abdominal air
is identified on the provided upright view.

No acute osseous abnormalities are seen; the sacroiliac joints are
unremarkable in appearance.
IMPRESSION: 1. Unremarkable bowel gas pattern; no free intra-abdominal air seen.
Small amount of stool noted in the colon.
2. No acute cardiopulmonary process seen.

## 2019-01-20 IMAGING — CR DG CHEST 2V
2 series · 2 of 2 positions shown · non-contrast
Comparison: Chest radiograph 05/08/2017

CLINICAL DATA: Dizziness and near syncope

EXAM:
CHEST  2 VIEW

[chest pa]
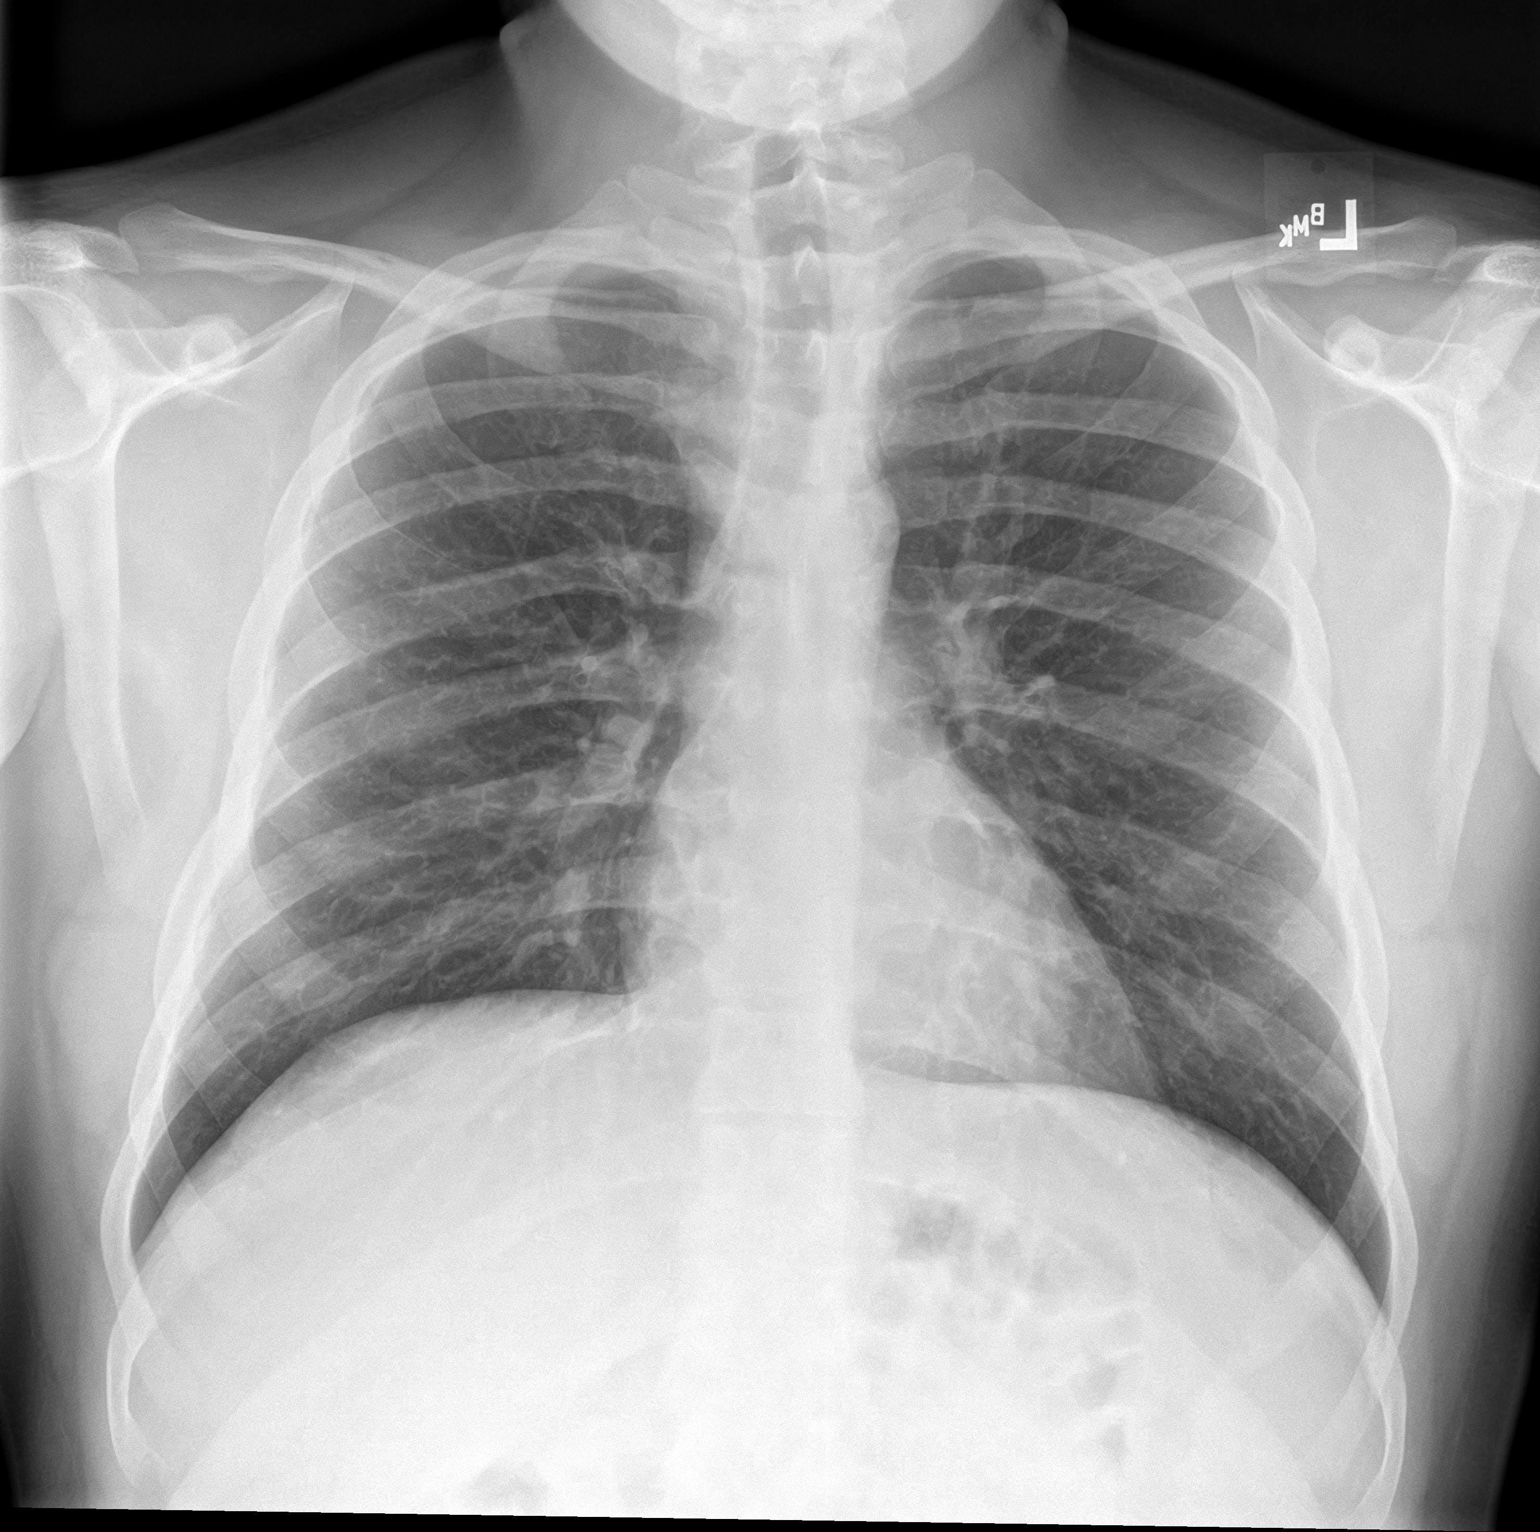

[chest lat]
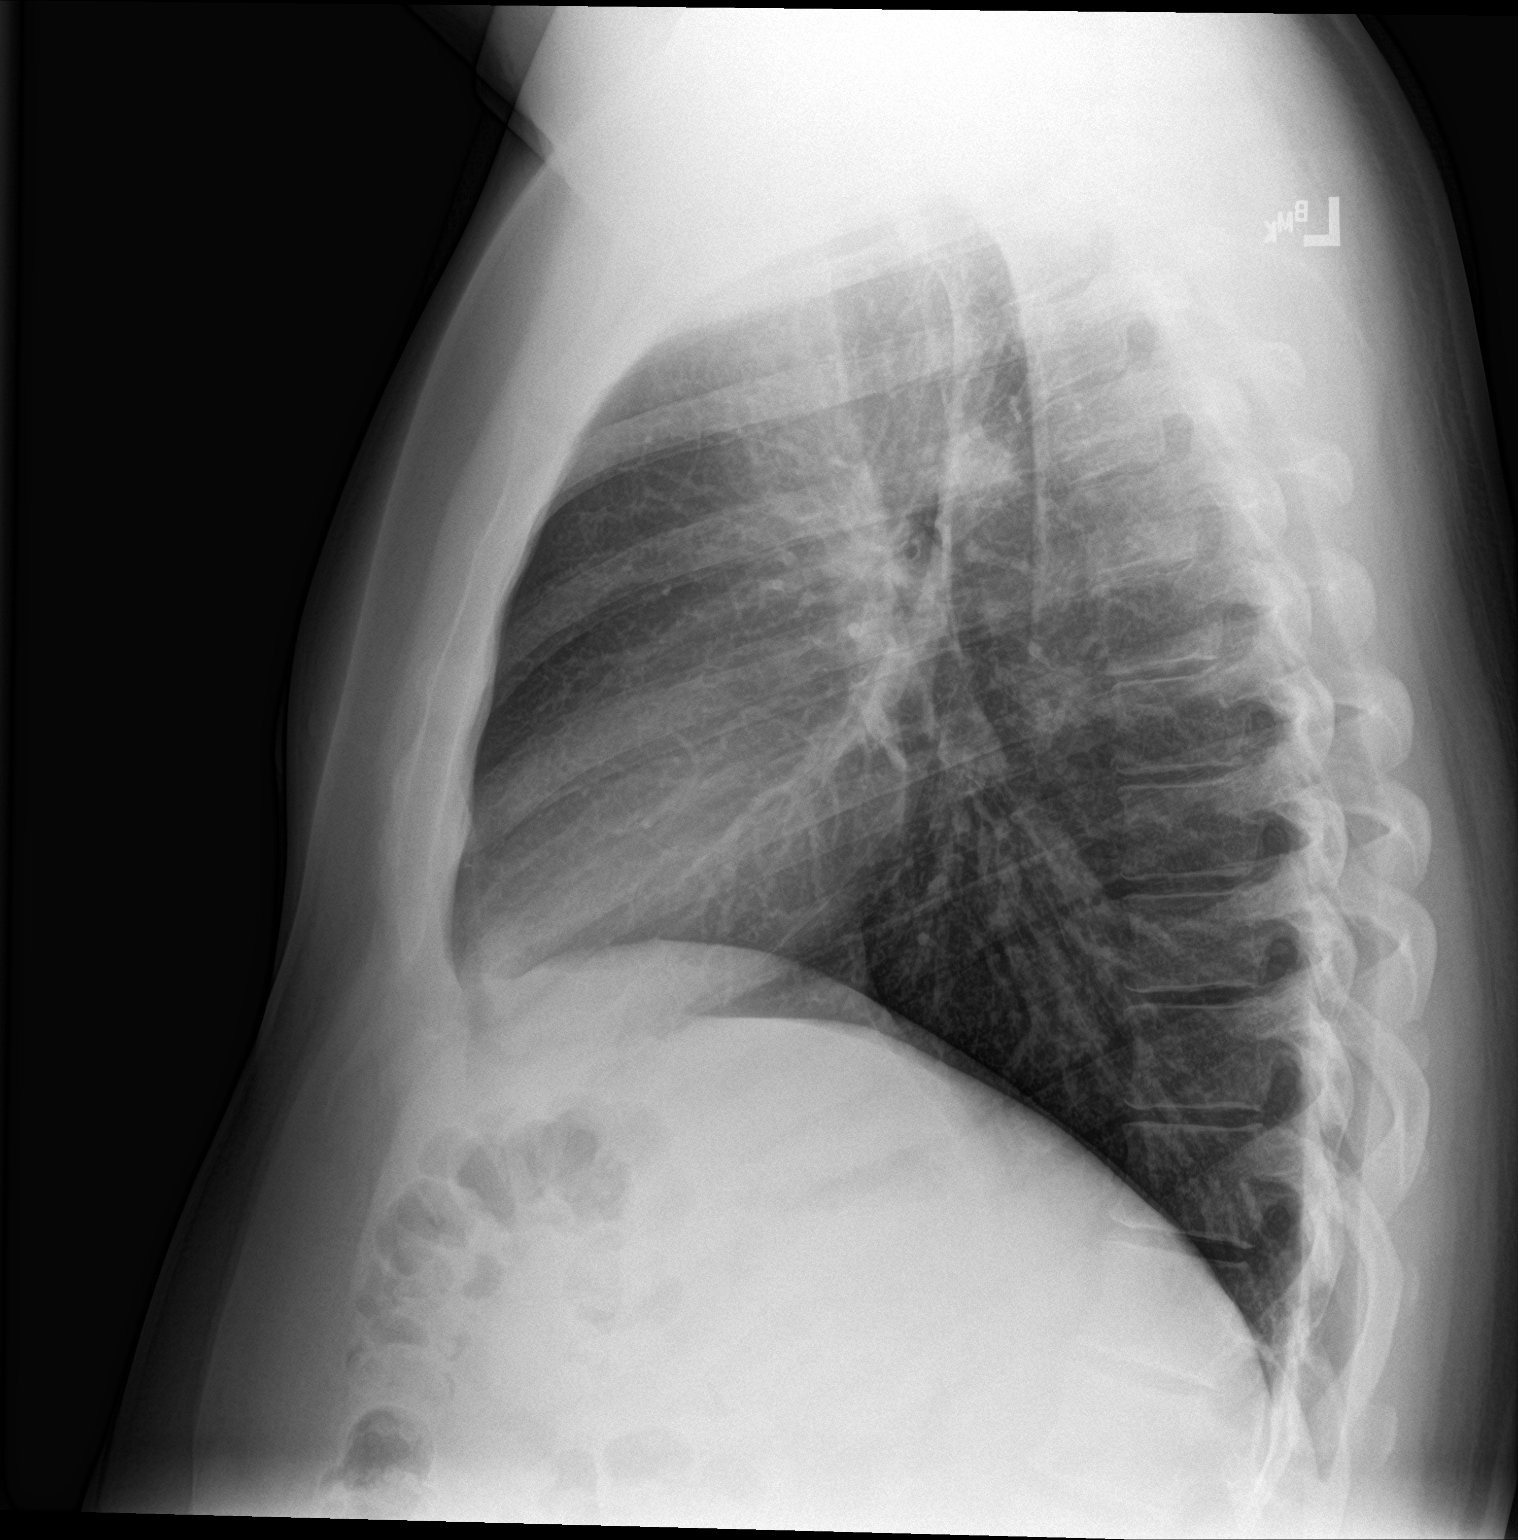

[2 of 2 positions shown; findings below may reference images not displayed]

FINDINGS: The heart size and mediastinal contours are within normal limits.
Both lungs are clear. The visualized skeletal structures are
unremarkable.
IMPRESSION: No active cardiopulmonary disease.

## 2020-10-16 ENCOUNTER — Other Ambulatory Visit: Payer: Self-pay

## 2020-10-16 ENCOUNTER — Encounter (HOSPITAL_COMMUNITY): Payer: Self-pay | Admitting: *Deleted

## 2020-10-16 ENCOUNTER — Emergency Department (HOSPITAL_COMMUNITY)
Admission: EM | Admit: 2020-10-16 | Discharge: 2020-10-16 | Disposition: A | Discharge status: Home / Self Care | Payer: BC Managed Care – PPO | Attending: Emergency Medicine | Admitting: Emergency Medicine

## 2020-10-16 DIAGNOSIS — K029 Dental caries, unspecified: Secondary | ICD-10-CM | POA: Diagnosis not present

## 2020-10-16 DIAGNOSIS — K0889 Other specified disorders of teeth and supporting structures: Secondary | ICD-10-CM | POA: Diagnosis present

## 2020-10-16 DIAGNOSIS — F17210 Nicotine dependence, cigarettes, uncomplicated: Secondary | ICD-10-CM | POA: Diagnosis not present

## 2020-10-16 MED ORDER — AMOXICILLIN 500 MG PO CAPS: 500.0000 mg | ORAL_CAPSULE | Freq: Three times a day (TID) | ORAL | 0 refills | Status: AC

## 2020-10-16 MED ORDER — NAPROXEN 500 MG PO TABS: 500.0000 mg | ORAL_TABLET | Freq: Two times a day (BID) | ORAL | 0 refills | Status: AC

## 2020-10-16 NOTE — ED Provider Notes (Signed)
[facility] EMERGENCY DEPARTMENT Provider Note   CSN: [id number] Arrival date & time: 10/16/20  1924     History Chief Complaint  Patient presents with   Dental Pain    Richard Sanchez is a 30 y.o. male.   Dental Pain Associated symptoms: no fever    This patient is a 30 year old male presenting with a complaint of right lower first molar pain.  He noted 8 years ago that the tooth started to have a deep cavity on the side of the tooth, this was on the buccal side of the tooth, this has gradually progressed over the last couple of days he has had increasing pain, no swelling, no fever, the pain radiates down into the jaw.  He has been taking ibuprofen and Tylenol without relief.  He wants the tooth pulled  Past Medical History:  Diagnosis Date   Acid reflux    Idiopathic pericarditis     Patient Active Problem List   Diagnosis Date Noted   Chest pain 06/07/2017    Past Surgical History:  Procedure Laterality Date   APPENDECTOMY     kidney surgery (15 months old)         Family History  Problem Relation Age of Onset   High blood pressure Father    Colon cancer Neg Hx    Colon polyps Neg Hx     Social History   Tobacco Use   Smoking status: Every Day    Packs/day: 0.50    Pack years: 0.00    Types: Cigarettes   Smokeless tobacco: Never  Vaping Use   Vaping Use: Never used  Substance Use Topics   Alcohol use: Yes    Comment: occasionally   Drug use: No    Home Medications Prior to Admission medications   Medication Sig Start Date End Date Taking? Authorizing Provider  amoxicillin (AMOXIL) 500 MG capsule Take 1 capsule (500 mg total) by mouth 3 (three) times daily. 10/16/20  Yes Eber Hong, MD  naproxen (NAPROSYN) 500 MG tablet Take 1 tablet (500 mg total) by mouth 2 (two) times daily with a meal. 10/16/20  Yes Eber Hong, MD  omeprazole (PRILOSEC) 20 MG capsule Take 1 capsule (20 mg total) by mouth daily. 06/07/17   West Bali, MD     Allergies    Patient has no known allergies.  Review of Systems   Review of Systems  Constitutional:  Negative for fever.  HENT:  Positive for dental problem.    Physical Exam Updated Vital Signs BP (!) 144/96 (BP Location: Right Arm)   Pulse (!) 102   Temp 98.2 F (36.8 C) (Oral)   Resp 14   Ht 1.778 m (5\' 10" )   Wt 99.8 kg   SpO2 97%   BMI 31.57 kg/m   Physical Exam Constitutional:      Comments: Well-appearing, no distress  HENT:     Head:     Comments: Normal-appearing head, no swelling of the face    Nose: Nose normal. No congestion or rhinorrhea.     Mouth/Throat:     Comments: No trismus or torticollis, dentition is generally intact, no gingival abscesses, no sublingual tenderness or abscess, mild tenderness over the right lower first molar with obvious deep caries on the buccal aspect Eyes:     General:        Right eye: No discharge.        Left eye: No discharge.     Conjunctiva/sclera: Conjunctivae normal.  Cardiovascular:     Rate and Rhythm: Normal rate.  Skin:    Findings: No rash.  Neurological:     General: No focal deficit present.    ED Results / Procedures / Treatments   Labs (all labs ordered are listed, but only abnormal results are displayed) Labs Reviewed - No data to display  EKG None  Radiology No results found.  Procedures Procedures   Medications Ordered in ED Medications - No data to display  ED Course  I have reviewed the triage vital signs and the nursing notes.  Pertinent labs & imaging results that were available during my care of the patient were reviewed by me and considered in my medical decision making (see chart for details).    MDM Rules/Calculators/A&P                          No signs of severe infection, needs referral to dentistry, given amoxicillin and naproxen, the patient wanted the tooth pulled here, I told him it was not within the scope of practice of an emergency physician to pull a tooth that  is almost fully intact  Final Clinical Impression(s) / ED Diagnoses Final diagnoses:  Dental caries    Rx / DC Orders ED Discharge Orders          Ordered    amoxicillin (AMOXIL) 500 MG capsule  3 times daily        10/16/20 2011    naproxen (NAPROSYN) 500 MG tablet  2 times daily with meals        10/16/20 2011             Eber Hong, MD 10/16/20 2014

## 2020-10-16 NOTE — ED Triage Notes (Signed)
Pt with dental pain to right lower, noted hole about 8 yrs ago but last two days having pain. Pt does not have a dentist.

## 2020-10-16 NOTE — Discharge Instructions (Addendum)
Dr. [facility] [phone]  Dr. Lucretia Roers and will be able to take care of your tooth for you  Take amoxicillin 3 times a day, naproxen twice a day  ER for worsening swelling fevers or severe pain

## 2023-02-25 ENCOUNTER — Ambulatory Visit
Admission: EM | Admit: 2023-02-25 | Discharge: 2023-02-25 | Disposition: A | Discharge status: Home / Self Care | Payer: BC Managed Care – PPO | Attending: Family Medicine | Admitting: Family Medicine

## 2023-02-25 DIAGNOSIS — R062 Wheezing: Secondary | ICD-10-CM

## 2023-02-25 DIAGNOSIS — J0100 Acute maxillary sinusitis, unspecified: Secondary | ICD-10-CM

## 2023-02-25 DIAGNOSIS — J3089 Other allergic rhinitis: Secondary | ICD-10-CM

## 2023-02-25 MED ORDER — ALBUTEROL SULFATE HFA 108 (90 BASE) MCG/ACT IN AERS: 2.0000 | INHALATION_SPRAY | RESPIRATORY_TRACT | 0 refills | Status: AC | PRN

## 2023-02-25 MED ORDER — AMOXICILLIN-POT CLAVULANATE 875-125 MG PO TABS: 1.0000 | ORAL_TABLET | Freq: Two times a day (BID) | ORAL | 0 refills | Status: AC

## 2023-02-25 MED ORDER — FLUTICASONE PROPIONATE 50 MCG/ACT NA SUSP: 1.0000 | Freq: Two times a day (BID) | NASAL | 2 refills | Status: AC

## 2023-02-25 MED ORDER — CETIRIZINE HCL 10 MG PO TABS: 10.0000 mg | ORAL_TABLET | Freq: Every day | ORAL | 2 refills | Status: AC

## 2023-02-25 MED ORDER — PROMETHAZINE-DM 6.25-15 MG/5ML PO SYRP: 5.0000 mL | ORAL_SOLUTION | Freq: Four times a day (QID) | ORAL | 0 refills | Status: AC | PRN

## 2023-02-25 NOTE — ED Triage Notes (Signed)
Pt states that he does have a cough and chest congestion.

## 2023-02-25 NOTE — ED Provider Notes (Signed)
[facility]    CSN: [id number] Arrival date & time: 02/25/23  0931      History   Chief Complaint Chief Complaint  Patient presents with   Nasal Congestion    Nasal congestion, sore throat and fatigue.     HPI Richard Sanchez is a 32 y.o. male.   Presenting today with 3-week history of progressively worsening cough, chest congestion, nasal congestion, sinus pain and pressure, fatigue, scratchy throat.  Denies fever, chills, chest pain, shortness of breath, abdominal pain, nausea vomiting or diarrhea.  Taking DayQuil, NyQuil, Mucinex with no relief.  History of seasonal allergies not currently on a regimen for this.  No known history of chronic pulmonary disease.    Past Medical History:  Diagnosis Date   Acid reflux    Idiopathic pericarditis     Patient Active Problem List   Diagnosis Date Noted   Chest pain 06/07/2017    Past Surgical History:  Procedure Laterality Date   APPENDECTOMY     kidney surgery (27 months old)         Home Medications    Prior to Admission medications   Medication Sig Start Date End Date Taking? Authorizing Provider  albuterol (VENTOLIN HFA) 108 (90 Base) MCG/ACT inhaler Inhale 2 puffs into the lungs every 4 (four) hours as needed. 02/25/23  Yes Particia Nearing, PA-C  amoxicillin-clavulanate (AUGMENTIN) 875-125 MG tablet Take 1 tablet by mouth every 12 (twelve) hours. 02/25/23  Yes Particia Nearing, PA-C  cetirizine (ZYRTEC ALLERGY) 10 MG tablet Take 1 tablet (10 mg total) by mouth daily. 02/25/23  Yes Particia Nearing, PA-C  fluticasone [facility]) 50 MCG/ACT nasal spray Place 1 spray into both nostrils 2 (two) times daily. 02/25/23  Yes Particia Nearing, PA-C  promethazine-dextromethorphan (PROMETHAZINE-DM) 6.25-15 MG/5ML syrup Take 5 mLs by mouth 4 (four) times daily as needed. 02/25/23  Yes Particia Nearing, PA-C  amoxicillin (AMOXIL) 500 MG capsule Take 1 capsule (500 mg total) by mouth 3  (three) times daily. 10/16/20   Eber Hong, MD  naproxen (NAPROSYN) 500 MG tablet Take 1 tablet (500 mg total) by mouth 2 (two) times daily with a meal. 10/16/20   Eber Hong, MD  omeprazole (PRILOSEC) 20 MG capsule Take 1 capsule (20 mg total) by mouth daily. 06/07/17   West Bali, MD    Family History Family History  Problem Relation Age of Onset   High blood pressure Father    Colon cancer Neg Hx    Colon polyps Neg Hx     Social History Social History   Tobacco Use   Smoking status: Former    Current packs/day: 0.50    Types: Cigarettes   Smokeless tobacco: Never  Vaping Use   Vaping status: Every Day  Substance Use Topics   Alcohol use: Yes    Comment: occasionally   Drug use: No     Allergies   Patient has no known allergies.   Review of Systems Review of Systems PER HPI  Physical Exam Triage Vital Signs ED Triage Vitals  Encounter Vitals Group     BP 02/25/23 0950 119/83     Systolic BP Percentile --      Diastolic BP Percentile --      Pulse Rate 02/25/23 0950 (!) 119     Resp 02/25/23 0950 17     Temp 02/25/23 0950 98.7 F (37.1 C)     Temp Source 02/25/23 0950 Oral     SpO2  02/25/23 0950 93 %     Weight 02/25/23 0948 220 lb (99.8 kg)     Height 02/25/23 0948 5\' 10"  (1.778 m)     Head Circumference --      Peak Flow --      Pain Score 02/25/23 0948 3     Pain Loc --      Pain Education --      Exclude from Growth Chart --    No data found.  Updated Vital Signs BP 119/83 (BP Location: Right Arm)   Pulse (!) 119   Temp 98.7 F (37.1 C) (Oral)   Resp 17   Ht 5\' 10"  (1.778 m)   Wt 220 lb (99.8 kg)   SpO2 97%   BMI 31.57 kg/m   Visual Acuity Right Eye Distance:   Left Eye Distance:   Bilateral Distance:    Right Eye Near:   Left Eye Near:    Bilateral Near:     Physical Exam Vitals and nursing note reviewed.  Constitutional:      Appearance: He is well-developed.  HENT:     Head: Atraumatic.     Right Ear: External  ear normal.     Left Ear: External ear normal.     Nose: Congestion present.     Mouth/Throat:     Pharynx: Posterior oropharyngeal erythema present. No oropharyngeal exudate.  Eyes:     Conjunctiva/sclera: Conjunctivae normal.     Pupils: Pupils are equal, round, and reactive to light.  Cardiovascular:     Rate and Rhythm: Normal rate and regular rhythm.  Pulmonary:     Effort: Pulmonary effort is normal. No respiratory distress.     Breath sounds: Wheezing (left upper) present. No rales.  Musculoskeletal:        General: Normal range of motion.     Cervical back: Normal range of motion and neck supple.  Lymphadenopathy:     Cervical: No cervical adenopathy.  Skin:    General: Skin is warm and dry.  Neurological:     Mental Status: He is alert and oriented to person, place, and time.  Psychiatric:        Behavior: Behavior normal.      UC Treatments / Results  Labs (all labs ordered are listed, but only abnormal results are displayed) Labs Reviewed - No data to display  EKG   Radiology No results found.  Procedures Procedures (including critical care time)  Medications Ordered in UC Medications - No data to display  Initial Impression / Assessment and Plan / UC Course  I have reviewed the triage vital signs and the nursing notes.  Pertinent labs & imaging results that were available during my care of the patient were reviewed by me and considered in my medical decision making (see chart for details).     Given duration and worsening course, treat with Augmentin, Phenergan DM, start good allergy regimen of Zyrtec and Flonase and albuterol sent for wheezes as needed.  Discussed supportive over-the-counter medications, home care and return precautions.  Final Clinical Impressions(s) / UC Diagnoses   Final diagnoses:  Acute non-recurrent maxillary sinusitis  Seasonal allergic rhinitis due to other allergic trigger  Wheezing   Discharge Instructions   None     ED Prescriptions     Medication Sig Dispense Auth. Provider   amoxicillin-clavulanate (AUGMENTIN) 875-125 MG tablet Take 1 tablet by mouth every 12 (twelve) hours. 14 tablet Particia Nearing, [place]   albuterol (VENTOLIN HFA) 108 (  90 Base) MCG/ACT inhaler Inhale 2 puffs into the lungs every 4 (four) hours as needed. 18 g Roosvelt Maser [place], [place]   promethazine-dextromethorphan (PROMETHAZINE-DM) 6.25-15 MG/5ML syrup Take 5 mLs by mouth 4 (four) times daily as needed. 100 mL Particia Nearing, PA-C   cetirizine (ZYRTEC ALLERGY) 10 MG tablet Take 1 tablet (10 mg total) by mouth daily. 30 tablet Particia Nearing, PA-C   fluticasone [facility]) 50 MCG/ACT nasal spray Place 1 spray into both nostrils 2 (two) times daily. 16 g Particia Nearing, [place]      PDMP not reviewed this encounter.   Particia Nearing, [place] 02/25/23 1049

## 2023-02-25 NOTE — ED Triage Notes (Signed)
X3 weeks Pt states that he has some nasal congestion, fatigue and sore throat.
